# Patient Record
Sex: Male | Born: 1960 | Race: White | Hispanic: No | Marital: Single | Smoking: Former smoker
Health system: Southern US, Community
[De-identification: ages and names within clinical notes are randomized; demographics above are authoritative.]

## PROBLEM LIST (undated history)

## (undated) DIAGNOSIS — F4310 Post-traumatic stress disorder, unspecified: Secondary | ICD-10-CM

## (undated) DIAGNOSIS — I10 Essential (primary) hypertension: Secondary | ICD-10-CM

## (undated) DIAGNOSIS — E785 Hyperlipidemia, unspecified: Secondary | ICD-10-CM

## (undated) HISTORY — PX: WISDOM TOOTH EXTRACTION: SHX21

## (undated) HISTORY — PX: TONSILLECTOMY: SUR1361

## (undated) HISTORY — PX: BACK SURGERY: SHX140

---

## 1998-11-15 ENCOUNTER — Encounter: Admission: RE | Admit: 1998-11-15 | Discharge: 1999-02-13 | Payer: Self-pay | Admitting: Anesthesiology

## 1999-02-01 ENCOUNTER — Encounter: Admission: RE | Admit: 1999-02-01 | Discharge: 1999-02-16 | Payer: Self-pay | Admitting: Neurological Surgery

## 1999-02-09 ENCOUNTER — Encounter: Payer: Self-pay | Admitting: Neurological Surgery

## 1999-02-09 ENCOUNTER — Encounter: Admission: RE | Admit: 1999-02-09 | Discharge: 1999-02-09 | Payer: Self-pay | Admitting: Neurological Surgery

## 1999-04-29 ENCOUNTER — Encounter: Payer: Self-pay | Admitting: Neurological Surgery

## 1999-05-03 ENCOUNTER — Inpatient Hospital Stay (HOSPITAL_COMMUNITY): Admission: RE | Admit: 1999-05-03 | Discharge: 1999-05-06 | Payer: Self-pay | Admitting: Neurological Surgery

## 1999-05-03 ENCOUNTER — Encounter: Payer: Self-pay | Admitting: Neurological Surgery

## 1999-05-24 ENCOUNTER — Encounter: Payer: Self-pay | Admitting: Neurological Surgery

## 1999-05-24 ENCOUNTER — Encounter: Admission: RE | Admit: 1999-05-24 | Discharge: 1999-05-24 | Payer: Self-pay | Admitting: Neurological Surgery

## 1999-07-04 ENCOUNTER — Encounter: Admission: RE | Admit: 1999-07-04 | Discharge: 1999-07-04 | Payer: Self-pay | Admitting: Neurological Surgery

## 1999-07-04 ENCOUNTER — Encounter: Payer: Self-pay | Admitting: Neurological Surgery

## 1999-08-26 ENCOUNTER — Encounter: Payer: Self-pay | Admitting: Neurological Surgery

## 1999-08-26 ENCOUNTER — Encounter: Admission: RE | Admit: 1999-08-26 | Discharge: 1999-08-26 | Payer: Self-pay | Admitting: Neurological Surgery

## 1999-10-25 ENCOUNTER — Encounter: Admission: RE | Admit: 1999-10-25 | Discharge: 1999-12-27 | Payer: Self-pay | Admitting: Neurological Surgery

## 2000-04-14 ENCOUNTER — Encounter: Payer: Self-pay | Admitting: Neurological Surgery

## 2000-04-14 ENCOUNTER — Ambulatory Visit (HOSPITAL_COMMUNITY): Admission: RE | Admit: 2000-04-14 | Discharge: 2000-04-14 | Payer: Self-pay | Admitting: Neurological Surgery

## 2000-07-23 ENCOUNTER — Encounter: Admission: RE | Admit: 2000-07-23 | Discharge: 2000-07-31 | Payer: Self-pay | Admitting: Anesthesiology

## 2009-01-24 ENCOUNTER — Emergency Department (HOSPITAL_COMMUNITY): Admission: EM | Admit: 2009-01-24 | Discharge: 2009-01-24 | Payer: Self-pay | Admitting: Emergency Medicine

## 2010-08-05 NOTE — Op Note (Signed)
[facility]  Patient:    Brandon Thornton, Brandon Thornton                   MRN: [id number] Proc. Date: 05/03/99 Adm. Date:  [id number] Attending:  Jonne Ply                           Operative Report  PREOPERATIVE DIAGNOSES: 1. Spondylolisthesis lumbar vertebrae-5/sacral vertebrae-1. 2. Herniated nucleus pulposus lumbar vertebrae-4/5 and lumbar vertebrae-5/sacral    vertebrae-1. 3. Lumbar radiculopathy.  POSTOPERATIVE DIAGNOSES: 1. Spondylolisthesis lumbar vertebrae-5/sacral vertebrae-1. 2. Herniated nucleus pulposus lumbar vertebrae-4/5 and lumbar vertebrae-5/sacral    vertebrae-1. 3. Lumbar radiculopathy.  OPERATIONS: 1. Lumbar Gill procedure L5. 2. Discectomy L4/5 and L5-S1. 3. Posterior interbody arthrodesis with Ray cage technique L4/5 and L5-S1. 4. Fixation from L4 to sacrum with pedicular segmental fixation and posterior iliac    crest bone graft from right, posterior-superior iliac crest through separate  fascial incisions.  SURGEON:  Stefani Dama, M.D.  ASSISTANT:  Hewitt Shorts, M.D.  ANESTHESIA:  General endotracheal anesthesia.  INDICATION:  The patient is a 50 year old individual who has had significant back and bilateral lower extremity pain.  He was found to have herniated nucleus pulposus at L4/5 and at L5-S1 and spondylolisthesis at L5-S1.  He failed extensive effort at conservative management and is taken to the operating room.  DESCRIPTION OF PROCEDURE:  The patient was brought to the operating room supine on a stretcher.  After smooth induction of general endotracheal anesthesia, he was  turned prone.  The back was shaved, prepped with DuraPrep, and draped in a sterile fashion.  A midline incision was created and carried down to the lumbodorsal fascia. Subcutaneous tissue was then dissected off to the right side and the posterior-superior iliac crest on the right side was identified.  The  ilial gluteal fascia was opened and the outer table of the ilium was decorticated.  The Taylor retractor was placed in the wound and the cortical cancellous strips of bone were harvested from the outer table of the ilium.  The underlying cancellous bone was then harvested separately.  When adequate sample of bone was obtained, the ileal gluteal fascia was closed with #1 Vicryl in interrupted fashion.  Attention was then turned to the midline where a subperiosteal dissection of the laminar arches were carried out at L4 and L5.  L5 was noted to be loose.  The Gill procedure was performed here removing the entire arch of the lamina in one piece. The common dural tube and takeoff of the L5 nerve roots were then dissected free and the L5 nerve roots were noted to be in deep clot in the stenotic area at L5-S1 with the lateral protrusion of disc at the L5-S1 space closing foraminal stenosis at that level.  With this area being decompressed, the discectomy was performed at L5-S1 removing a large portion of significantly degenerated disc from the region of the foramen itself.  This was greater on the right than on the left.  Once this was performed, decompression of the nerve roots were performed along ith it.  L4/5 was then decompressed removing a large central disc herniation in the  subligamentous space and evacuating the disc space totally using a combination f rongeurs, curets, osteotomes, and periosteal elevators.  With the disc spaces being evacuated, the interspaces were instrumented with 12 mm tang instrument and then 12 by 21 mm cages were placed  at L5-S1 and countersunk  appropriately.  At L4/5, 12 by 21 mm cages were also placed after instrumenting the disc spaces  appropriately.  Pedicle entry sites were then chosen at L4, L5, and S1.  These ere sounded out and then tapped with a 5.25 tap and 5.75 by 40 screws were placed in all the holes except for the right  sacral hole which took a 45 mm screw. Localization of the screws were then obtained radiographically after connecting the screw tops with the curved rod measuring 60 mm in length.  Good positioning of the hardware was noted.  Lateral gutters were then packed with cortical cancellous bone that was harvested previously.  Cages were packed with  bone and plastic endcaps were applied.  The area was copiously irrigated with antibiotic irrigating solution and then some pieces of subcutaneous fat and fascia which were harvested at the beginning of the case from the subcutaneous region ere placed over the common dural tube and the takeoff of the L4 and the L5 nerve roots respectively.  The wound was then closed with #1 Vicryl in the lumbodorsal fascia and 2-0 Vicryl in the subcutaneous and subcuticular tissue, 3-0 Vicryl subcuticularly for final skin closure.  The patient tolerated the procedure well.  Blood loss was estimated at 1200 cc nd three units of CellSaver totally 750 cc was returned to the patient. DD:  05/03/99 TD:  05/03/99 Job: [id number]

## 2016-01-05 ENCOUNTER — Emergency Department (HOSPITAL_COMMUNITY)
Admission: EM | Admit: 2016-01-05 | Discharge: 2016-01-05 | Disposition: A | Discharge status: Home / Self Care | Payer: Medicare HMO | Attending: Emergency Medicine | Admitting: Emergency Medicine

## 2016-01-05 ENCOUNTER — Emergency Department (HOSPITAL_COMMUNITY): Payer: Medicare HMO

## 2016-01-05 ENCOUNTER — Encounter (HOSPITAL_COMMUNITY): Payer: Self-pay

## 2016-01-05 DIAGNOSIS — B86 Scabies: Secondary | ICD-10-CM | POA: Diagnosis not present

## 2016-01-05 DIAGNOSIS — Z87891 Personal history of nicotine dependence: Secondary | ICD-10-CM | POA: Insufficient documentation

## 2016-01-05 DIAGNOSIS — R079 Chest pain, unspecified: Secondary | ICD-10-CM | POA: Diagnosis present

## 2016-01-05 DIAGNOSIS — Z7982 Long term (current) use of aspirin: Secondary | ICD-10-CM | POA: Insufficient documentation

## 2016-01-05 DIAGNOSIS — R0789 Other chest pain: Secondary | ICD-10-CM | POA: Insufficient documentation

## 2016-01-05 DIAGNOSIS — K219 Gastro-esophageal reflux disease without esophagitis: Secondary | ICD-10-CM | POA: Insufficient documentation

## 2016-01-05 LAB — CBC
HCT: 45.3 % (ref 39.0–52.0)
Hemoglobin: 15.4 g/dL (ref 13.0–17.0)
MCH: 31 pg (ref 26.0–34.0)
MCHC: 34 g/dL (ref 30.0–36.0)
MCV: 91.3 fL (ref 78.0–100.0)
PLATELETS: 251 10*3/uL (ref 150–400)
RBC: 4.96 MIL/uL (ref 4.22–5.81)
RDW: 12.7 % (ref 11.5–15.5)
WBC: 11 10*3/uL — AB (ref 4.0–10.5)

## 2016-01-05 LAB — BASIC METABOLIC PANEL
Anion gap: 12 (ref 5–15)
BUN: 9 mg/dL (ref 6–20)
CHLORIDE: 100 mmol/L — AB (ref 101–111)
CO2: 26 mmol/L (ref 22–32)
CREATININE: 1 mg/dL (ref 0.61–1.24)
Calcium: 9.9 mg/dL (ref 8.9–10.3)
Glucose, Bld: 118 mg/dL — ABNORMAL HIGH (ref 65–99)
Potassium: 3.7 mmol/L (ref 3.5–5.1)
SODIUM: 138 mmol/L (ref 135–145)

## 2016-01-05 LAB — I-STAT TROPONIN, ED
TROPONIN I, POC: 0 ng/mL (ref 0.00–0.08)
TROPONIN I, POC: 0.01 ng/mL (ref 0.00–0.08)

## 2016-01-05 LAB — HEPATIC FUNCTION PANEL
ALT: 37 U/L (ref 17–63)
AST: 36 U/L (ref 15–41)
Albumin: 4.6 g/dL (ref 3.5–5.0)
Alkaline Phosphatase: 74 U/L (ref 38–126)
BILIRUBIN DIRECT: 0.2 mg/dL (ref 0.1–0.5)
BILIRUBIN INDIRECT: 1.2 mg/dL — AB (ref 0.3–0.9)
BILIRUBIN TOTAL: 1.4 mg/dL — AB (ref 0.3–1.2)
Total Protein: 8 g/dL (ref 6.5–8.1)

## 2016-01-05 LAB — LIPASE, BLOOD: LIPASE: 27 U/L (ref 11–51)

## 2016-01-05 MED ORDER — GI COCKTAIL ~~LOC~~
30.0000 mL | Freq: Once | ORAL | Status: AC
  Administered 2016-01-05: 30 mL via ORAL
  Filled 2016-01-05: qty 30

## 2016-01-05 MED ORDER — PERMETHRIN 5 % EX CREA: TOPICAL_CREAM | CUTANEOUS | 0 refills | Status: DC

## 2016-01-05 MED ORDER — RANITIDINE HCL 150 MG PO TABS: 150.0000 mg | ORAL_TABLET | Freq: Two times a day (BID) | ORAL | 0 refills | Status: DC

## 2016-01-05 MED ORDER — HYDROXYZINE HCL 25 MG PO TABS: 25.0000 mg | ORAL_TABLET | Freq: Four times a day (QID) | ORAL | 0 refills | Status: DC | PRN

## 2016-01-05 MED ORDER — HYDROXYZINE HCL 25 MG PO TABS
50.0000 mg | ORAL_TABLET | Freq: Once | ORAL | Status: AC
  Administered 2016-01-05: 50 mg via ORAL
  Filled 2016-01-05: qty 2

## 2016-01-05 NOTE — ED Provider Notes (Signed)
[facility]-EMERGENCY DEPT Provider Note   CSN: [id number] Arrival date & time: 01/05/16  1447     History   Chief Complaint No chief complaint on file.   HPI Brandon Thornton is a 55 y.o. male.  HPI 55 year old male with no significant past medical history presents with transient chest and left arm pain. The patient states his symptoms started last night. He ate 2 corn dogs and then began to feel mild, epigastric bloating and aching pain. The pain occasionally radiated to his left shoulder and arm. He belched several times and his symptoms improved and he was able sleep throughout the night. This morning, the patient has had persistent, mild, but improving pain. He also noticed an aching sensation in his left shoulder and arm. He subsequent decided to present for evaluation. He was advised to come to the ED by his friend who is a Engineer, civil (consulting)nurse. Currently, he states his pain has completely resolved and he denies any complaints. Denies any shortness of breath, palpitations, or diaphoresis.  History reviewed. No pertinent past medical history.  There are no active problems to display for this patient.   History reviewed. No pertinent surgical history.     Home Medications    Prior to Admission medications   Medication Sig Start Date End Date Taking? Authorizing Provider  aspirin 81 MG chewable tablet Chew 81 mg by mouth daily.   Yes Historical Provider, MD  diphenhydrAMINE (BENADRYL) 25 MG tablet Take 25 mg by mouth every 6 (six) hours as needed for itching.   Yes Historical Provider, MD  ibuprofen (ADVIL,MOTRIN) 200 MG tablet Take 200 mg by mouth every 6 (six) hours as needed.   Yes Historical Provider, MD  hydrOXYzine (ATARAX/VISTARIL) 25 MG tablet Take 1-2 tablets (25-50 mg total) by mouth every 6 (six) hours as needed for itching. 01/05/16   Shaune Pollackameron Jamisen Duerson, MD  permethrin (ELIMITE) 5 % cream Apply to affected area once 01/05/16   Shaune Pollackameron Kamare Caspers, MD  ranitidine (ZANTAC) 150 MG tablet Take 1  tablet (150 mg total) by mouth 2 (two) times daily. 01/05/16 01/15/16  Shaune Pollackameron Tarra Pence, MD    Family History No family history on file.  Social History Social History  Substance Use Topics  . Smoking status: Former Games developermoker  . Smokeless tobacco: Never Used  . Alcohol use Not on file     Allergies   Review of patient's allergies indicates no known allergies.   Review of Systems Review of Systems  Constitutional: Negative for chills, fatigue and fever.  HENT: Negative for congestion and rhinorrhea.   Eyes: Negative for visual disturbance.  Respiratory: Positive for chest tightness (Transient, now resolved). Negative for cough, shortness of breath and wheezing.   Cardiovascular: Positive for chest pain (Transient, now resolved). Negative for leg swelling.  Gastrointestinal: Negative for abdominal pain, diarrhea, nausea and vomiting.  Genitourinary: Negative for dysuria and flank pain.  Musculoskeletal: Negative for neck pain and neck stiffness.  Skin: Negative for rash and wound.  Allergic/Immunologic: Negative for immunocompromised state.  Neurological: Negative for syncope, weakness and headaches.  All other systems reviewed and are negative.    Physical Exam Updated Vital Signs BP 152/86 (BP Location: Right Arm)   Pulse 79   Temp 98 F (36.7 C) (Oral)   Resp 18   SpO2 99%   Physical Exam  Constitutional: He is oriented to person, place, and time. He appears well-developed and well-nourished. No distress.  HENT:  Head: Normocephalic and atraumatic.  Eyes: Conjunctivae are normal.  Neck:  Neck supple.  Cardiovascular: Normal rate, regular rhythm and normal heart sounds.  Exam reveals no friction rub.   No murmur heard. Pulmonary/Chest: Effort normal and breath sounds normal. No respiratory distress. He has no wheezes. He has no rales.  Abdominal: He exhibits no distension.  Musculoskeletal: He exhibits no edema.  Neurological: He is alert and oriented to person,  place, and time. He exhibits normal muscle tone.  Skin: Skin is warm. Capillary refill takes less than 2 seconds.  Diffuse skin excoriations. Linear, erythematous lesions throughout the trunk and digits with interdigital involvement.  Psychiatric: He has a normal mood and affect.  Nursing note and vitals reviewed.    ED Treatments / Results  Labs (all labs ordered are listed, but only abnormal results are displayed) Labs Reviewed  BASIC METABOLIC PANEL - Abnormal; Notable for the following:       Result Value   Chloride 100 (*)    Glucose, Bld 118 (*)    All other components within normal limits  CBC - Abnormal; Notable for the following:    WBC 11.0 (*)    All other components within normal limits  HEPATIC FUNCTION PANEL - Abnormal; Notable for the following:    Total Bilirubin 1.4 (*)    Indirect Bilirubin 1.2 (*)    All other components within normal limits  LIPASE, BLOOD  I-STAT TROPOININ, ED  I-STAT TROPOININ, ED    EKG  EKG Interpretation  Date/Time:  Wednesday January 05 2016 14:58:14 EDT Ventricular Rate:  82 PR Interval:  146 QRS Duration: 82 QT Interval:  364 QTC Calculation: 425 R Axis:   12 Text Interpretation:  Normal sinus rhythm Normal ECG No significant change since last tracing Confirmed by Rayshell Goecke MD, Sheria Lang [phone]) on 01/06/2016 2:18:43 AM       Radiology Dg Chest 2 View  Result Date: 01/05/2016 CLINICAL DATA:  Left arm pain. EXAM: CHEST  2 VIEW COMPARISON:  None. FINDINGS: The heart size and mediastinal contours are within normal limits. Both lungs are clear. No pneumothorax or pleural effusion is noted. Old left clavicular fracture is noted. IMPRESSION: No active cardiopulmonary disease. Electronically Signed   By: Lupita Raider, M.D.   On: 01/05/2016 15:32    Procedures Procedures (including critical care time)  Medications Ordered in ED Medications  gi cocktail (Maalox,Lidocaine,Donnatal) (30 mLs Oral Given 01/05/16 1956)  hydrOXYzine  (ATARAX/VISTARIL) tablet 50 mg (50 mg Oral Given 01/05/16 2219)     Initial Impression / Assessment and Plan / ED Course  I have reviewed the triage vital signs and the nursing notes.  Pertinent labs & imaging results that were available during my care of the patient were reviewed by me and considered in my medical decision making (see chart for details).  Clinical Course   56 yo M with no significant PMHx who p/w atypical chest pain that resolves with belching, began after eating. Also incidentally noted pruritic rash. Regarding his CP - suspect GERD/gastritis given relaiton to eating, resolution with belching, worsening with lying flat, and correlation with alcohol use. LFTs, Bili normal and lipase normal - doubt chole, pancreatitis. DDx includes mSK chest wall pain, less likely ACS. HEART score <3 and dleta trop negative, EKG non-ischemic - will need outpt follow-up but low suspicion for ACS at this time. CXR is normal. Otherwise, regarding his rash - exam is c/w scabies. No signs of superimposed cellulitis. Will treat with trial of PPIs for atypical CP/GERD, as well as permethrin/atarax for scabies. outpt f/u  encouraged for further work-up.  Final Clinical Impressions(s) / ED Diagnoses   Final diagnoses:  Scabies  Atypical chest pain  Gastroesophageal reflux disease without esophagitis    New Prescriptions Discharge Medication List as of 01/05/2016 10:22 PM    START taking these medications   Details  hydrOXYzine (ATARAX/VISTARIL) 25 MG tablet Take 1-2 tablets (25-50 mg total) by mouth every 6 (six) hours as needed for itching., Starting Wed 01/05/2016, Print    permethrin (ELIMITE) 5 % cream Apply to affected area once, Print    ranitidine (ZANTAC) 150 MG tablet Take 1 tablet (150 mg total) by mouth 2 (two) times daily., Starting Wed 01/05/2016, Until Sat 01/15/2016, Print         Shaune Pollack, MD 01/06/16 [phone]

## 2016-01-05 NOTE — ED Notes (Signed)
Patient verbalized understanding of discharge instructions and denies any further needs or questions at this time. VS stable. Patient ambulatory with steady gait.  

## 2016-01-05 NOTE — ED Triage Notes (Signed)
Patient complains of left arm pain and feeling on indigestion since 0900. denies cp. States that the pain has been constant. NAD

## 2018-07-25 ENCOUNTER — Other Ambulatory Visit: Payer: Self-pay

## 2018-07-25 ENCOUNTER — Ambulatory Visit
Admission: RE | Admit: 2018-07-25 | Discharge: 2018-07-25 | Disposition: A | Discharge status: Home / Self Care | Payer: Medicare HMO | Source: Ambulatory Visit | Attending: Family Medicine | Admitting: Family Medicine

## 2018-07-25 ENCOUNTER — Other Ambulatory Visit: Payer: Self-pay | Admitting: Family Medicine

## 2018-07-25 DIAGNOSIS — M25512 Pain in left shoulder: Secondary | ICD-10-CM

## 2018-07-25 DIAGNOSIS — M542 Cervicalgia: Secondary | ICD-10-CM

## 2018-07-25 DIAGNOSIS — M25511 Pain in right shoulder: Secondary | ICD-10-CM

## 2018-07-25 DIAGNOSIS — M25559 Pain in unspecified hip: Secondary | ICD-10-CM

## 2020-02-20 IMAGING — CR CERVICAL SPINE - 2-3 VIEW
2 series · 2 of 2 positions shown · non-contrast
Comparison: 09/13/2006

CLINICAL DATA: Neck pain

EXAM:
CERVICAL SPINE - 2-3 VIEW

[w cervical spine ap]
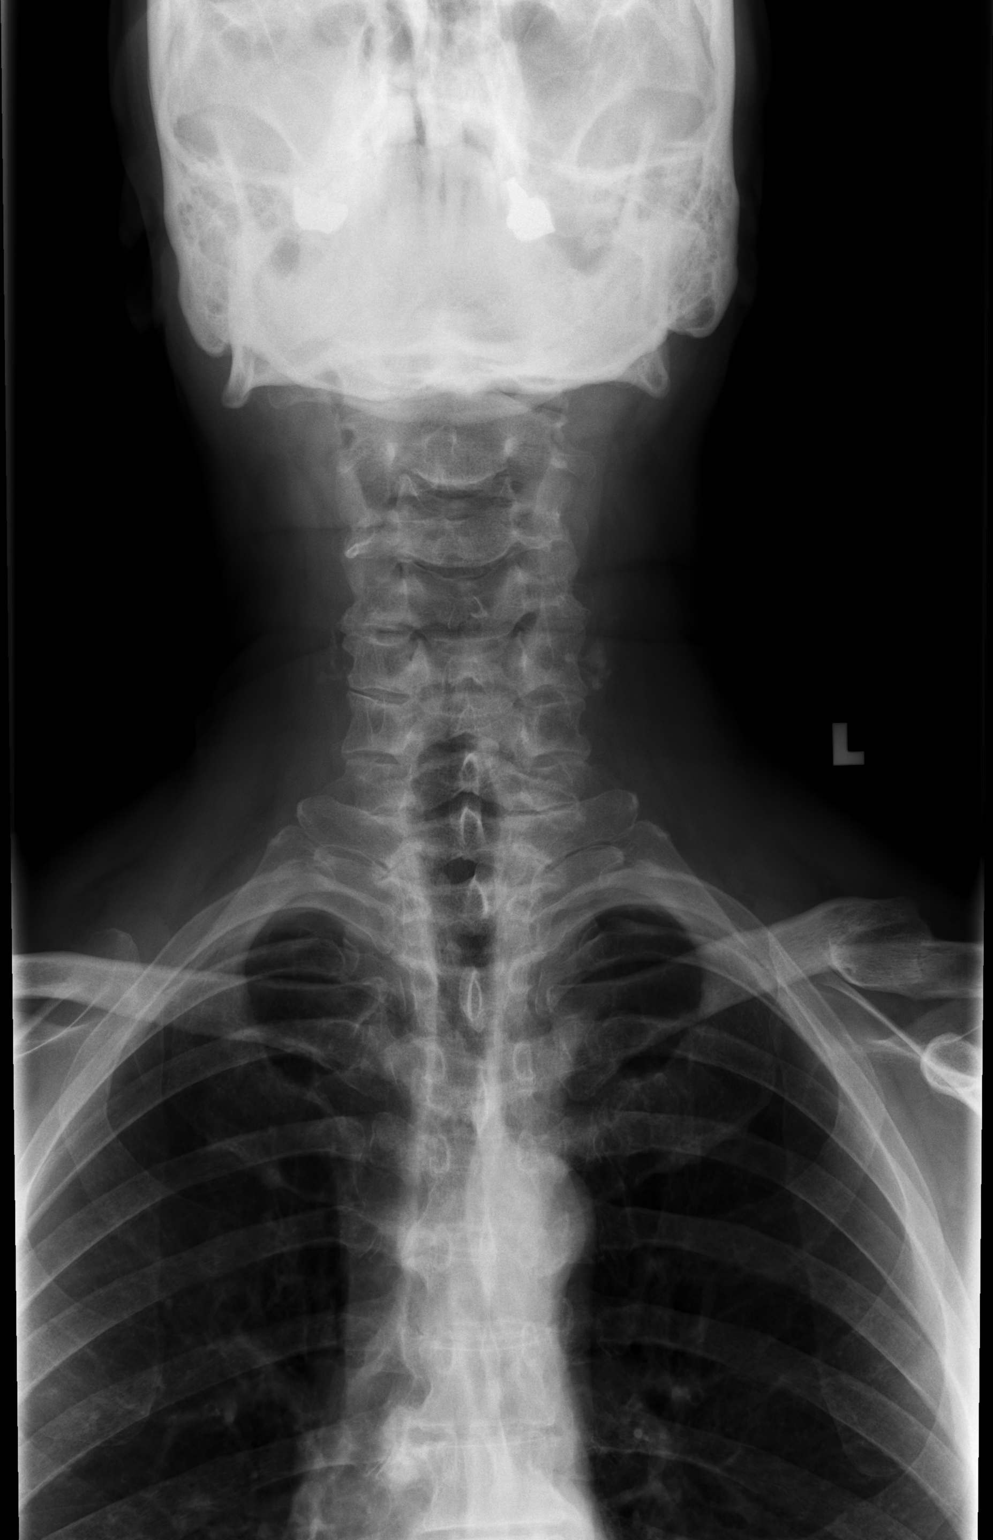

[w cervical spine lat]
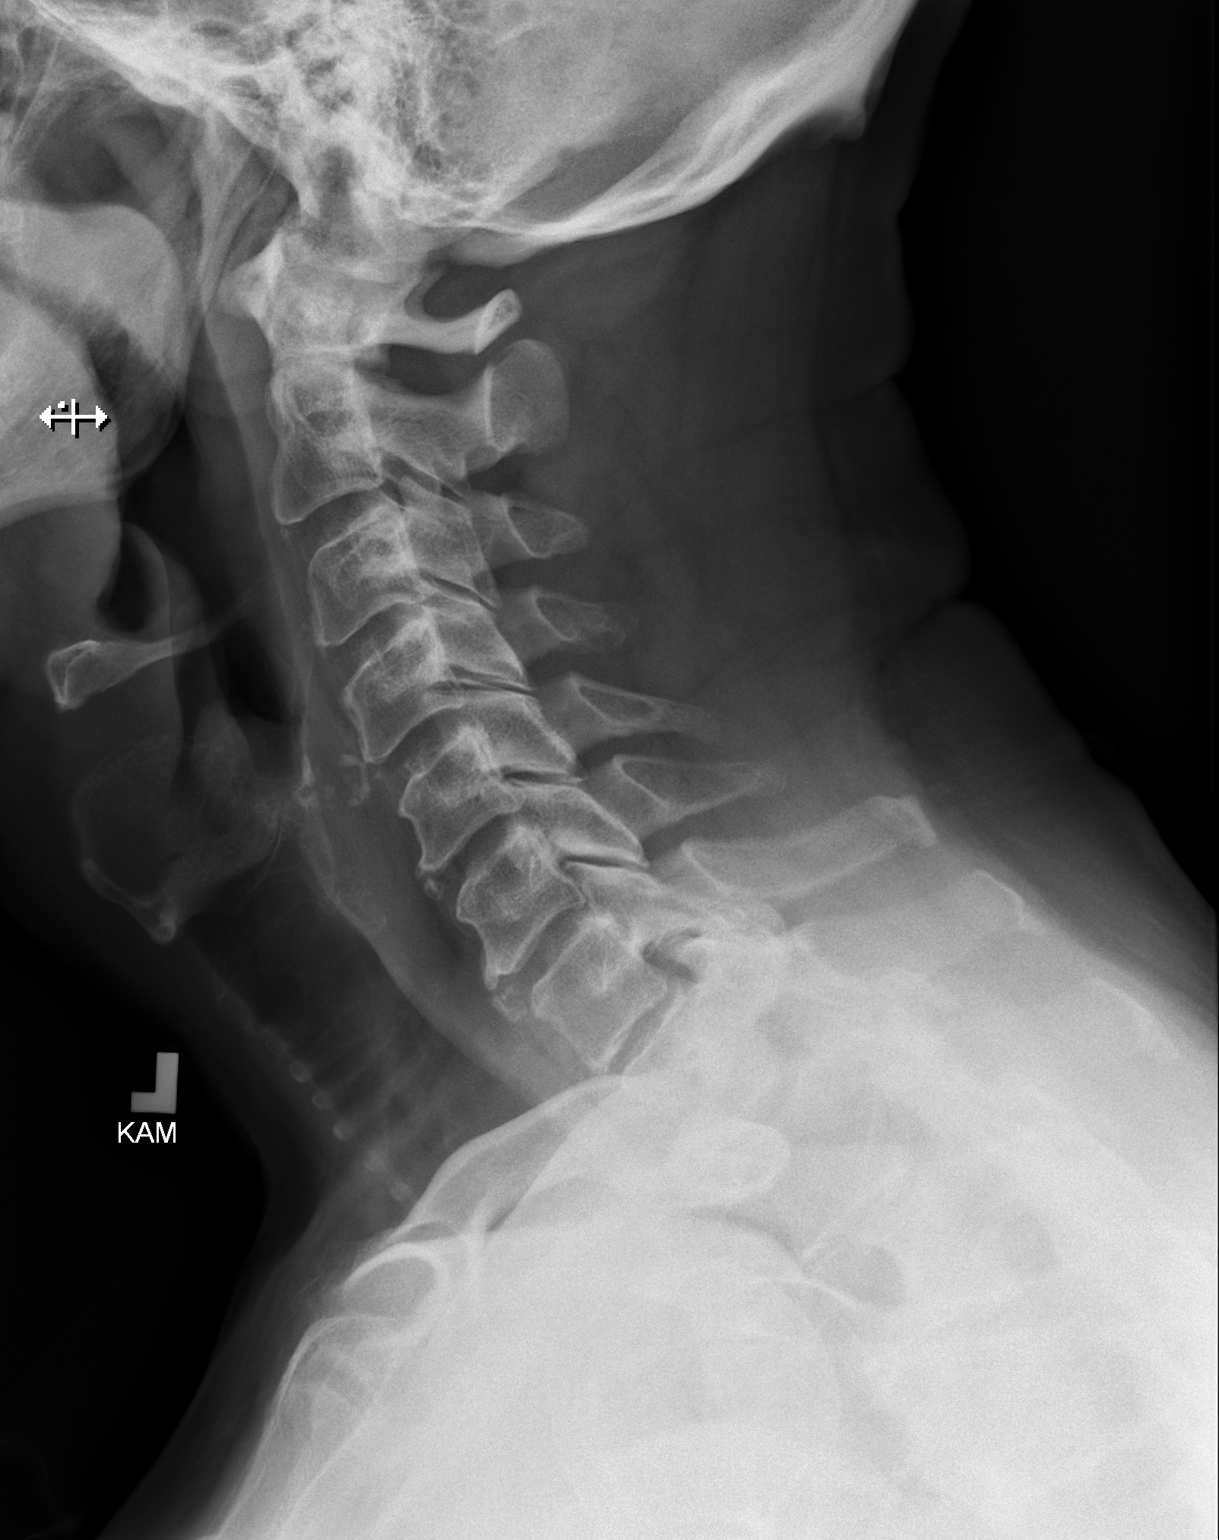

[2 of 2 positions shown; findings below may reference images not displayed]

FINDINGS: There is no evidence of cervical spine fracture or prevertebral soft
tissue swelling. Alignment is normal. No other significant bone
abnormalities are identified. Mild degenerative changes are noted,
greatest at the C7-T1 level. Bilateral carotid artery calcifications
are noted. There is an old healed left-sided clavicle fracture.
IMPRESSION: 1. No acute osseous abnormality detected.
2. Mild degenerative changes.
3. Carotid artery calcifications noted.
4. Old healed left-sided clavicle fracture.

## 2020-02-20 IMAGING — CR RIGHT SHOULDER - 1 VIEW
1 series · 1 of 1 positions shown · non-contrast
Comparison: None.

CLINICAL DATA: Pain

EXAM:
RIGHT SHOULDER - 1 VIEW

[w shoulder grashey right]
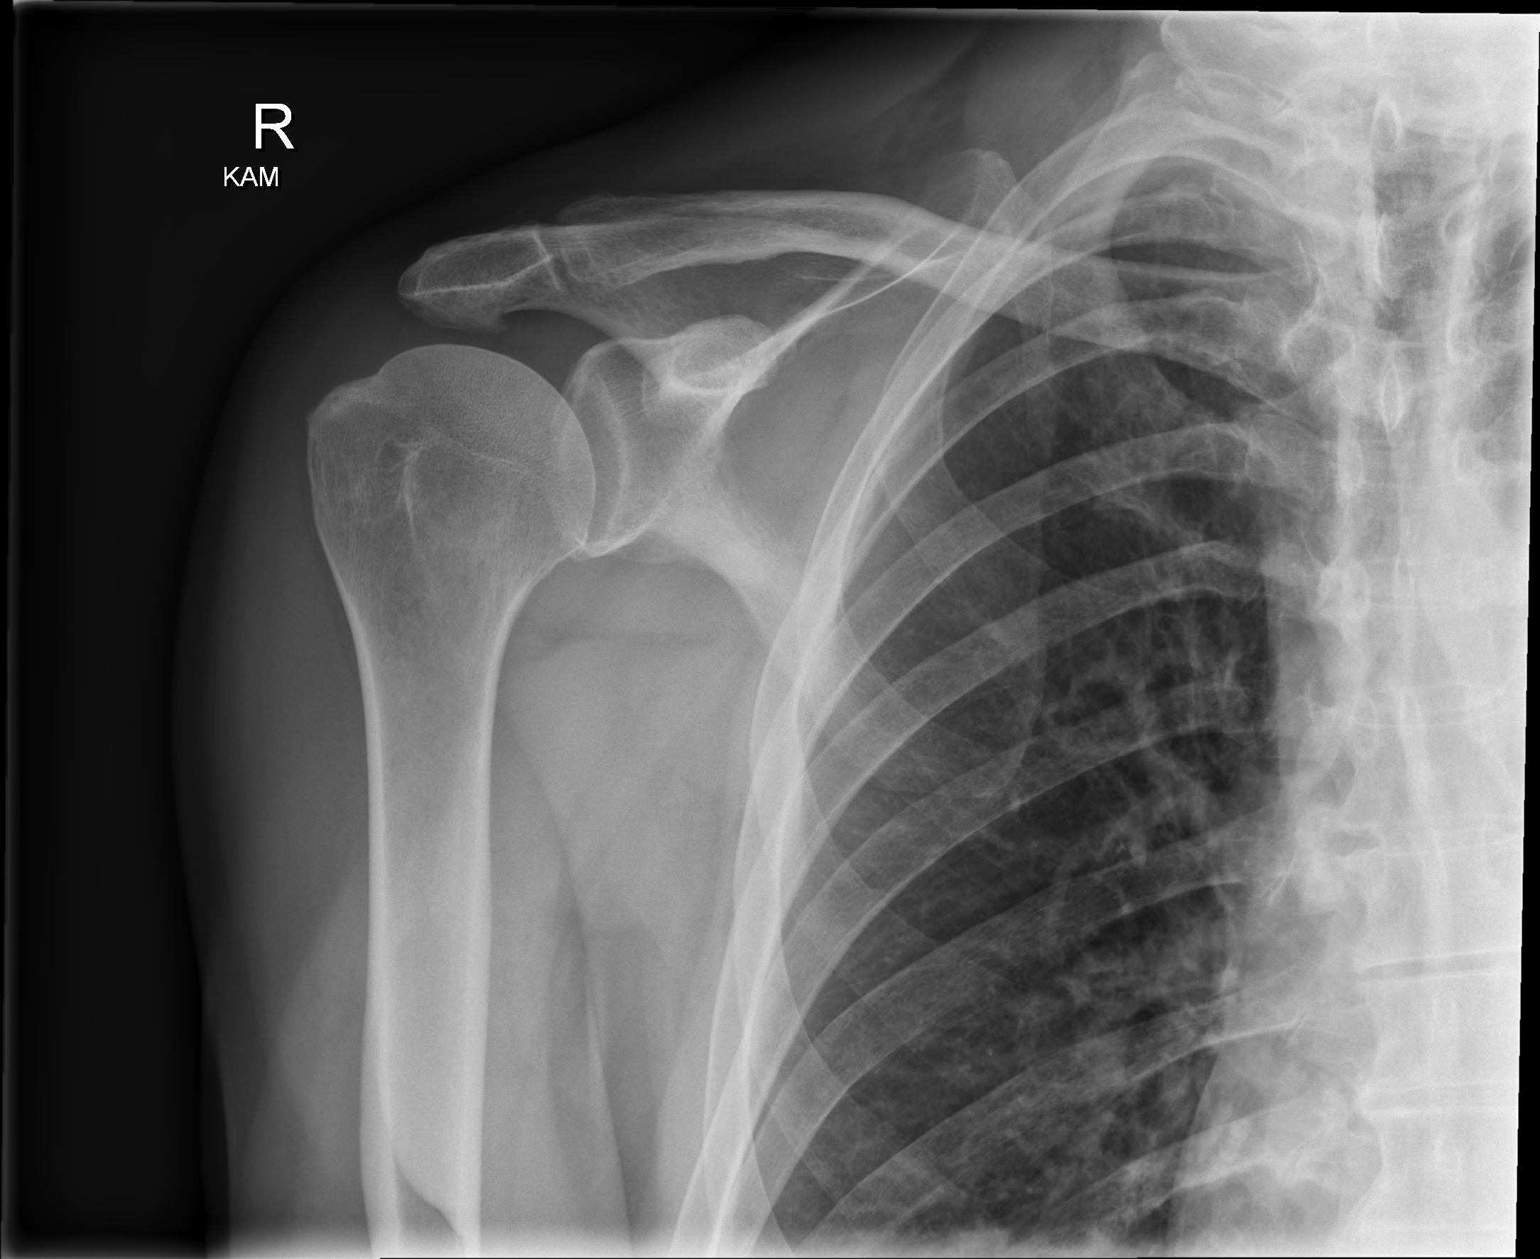

[1 of 1 positions shown; findings below may reference images not displayed]

FINDINGS: Examination is limited by single view technique. There is no
dislocation. No acute displaced fracture. Degenerative changes are
noted of the right acromioclavicular joint.
IMPRESSION: 1. Examination limited by single view technique.
2. No acute osseous abnormality detected.

## 2022-06-19 DEATH — deceased

## 2023-03-22 ENCOUNTER — Other Ambulatory Visit: Payer: Self-pay

## 2023-03-22 ENCOUNTER — Encounter (HOSPITAL_BASED_OUTPATIENT_CLINIC_OR_DEPARTMENT_OTHER): Payer: Self-pay | Admitting: Orthopedic Surgery

## 2023-03-23 ENCOUNTER — Encounter (HOSPITAL_BASED_OUTPATIENT_CLINIC_OR_DEPARTMENT_OTHER)
Admission: RE | Admit: 2023-03-23 | Discharge: 2023-03-23 | Disposition: A | Discharge status: Home / Self Care | Payer: Self-pay | Source: Ambulatory Visit | Attending: Orthopedic Surgery | Admitting: Orthopedic Surgery

## 2023-03-23 DIAGNOSIS — Z01810 Encounter for preprocedural cardiovascular examination: Secondary | ICD-10-CM | POA: Diagnosis present

## 2023-03-23 DIAGNOSIS — I10 Essential (primary) hypertension: Secondary | ICD-10-CM | POA: Diagnosis not present

## 2023-03-29 ENCOUNTER — Ambulatory Visit (HOSPITAL_BASED_OUTPATIENT_CLINIC_OR_DEPARTMENT_OTHER): Payer: Medicare HMO | Admitting: Anesthesiology

## 2023-03-29 ENCOUNTER — Encounter (HOSPITAL_BASED_OUTPATIENT_CLINIC_OR_DEPARTMENT_OTHER)
Admission: RE | Disposition: A | Discharge status: Home / Self Care | Payer: Self-pay | Source: Home / Self Care | Attending: Orthopedic Surgery

## 2023-03-29 ENCOUNTER — Encounter (HOSPITAL_BASED_OUTPATIENT_CLINIC_OR_DEPARTMENT_OTHER): Payer: Self-pay | Admitting: Orthopedic Surgery

## 2023-03-29 ENCOUNTER — Other Ambulatory Visit: Payer: Self-pay

## 2023-03-29 ENCOUNTER — Ambulatory Visit (HOSPITAL_BASED_OUTPATIENT_CLINIC_OR_DEPARTMENT_OTHER)
Admission: RE | Admit: 2023-03-29 | Discharge: 2023-03-29 | Disposition: A | Discharge status: Home / Self Care | Payer: Medicare HMO | Attending: Orthopedic Surgery | Admitting: Orthopedic Surgery

## 2023-03-29 DIAGNOSIS — G5622 Lesion of ulnar nerve, left upper limb: Secondary | ICD-10-CM | POA: Insufficient documentation

## 2023-03-29 DIAGNOSIS — Z87891 Personal history of nicotine dependence: Secondary | ICD-10-CM | POA: Diagnosis not present

## 2023-03-29 DIAGNOSIS — I10 Essential (primary) hypertension: Secondary | ICD-10-CM | POA: Diagnosis not present

## 2023-03-29 DIAGNOSIS — G5602 Carpal tunnel syndrome, left upper limb: Secondary | ICD-10-CM | POA: Diagnosis present

## 2023-03-29 DIAGNOSIS — Z79899 Other long term (current) drug therapy: Secondary | ICD-10-CM | POA: Insufficient documentation

## 2023-03-29 HISTORY — DX: Hyperlipidemia, unspecified: E78.5

## 2023-03-29 HISTORY — PX: ANTERIOR INTEROSSEOUS NERVE DECOMPRESSION: SHX5735

## 2023-03-29 HISTORY — DX: Essential (primary) hypertension: I10

## 2023-03-29 HISTORY — DX: Post-traumatic stress disorder, unspecified: F43.10

## 2023-03-29 SURGERY — ANTERIOR INTEROSSEOUS NERVE DECOMPRESSION
Anesthesia: General | Site: Wrist | Laterality: Left

## 2023-03-29 MED ORDER — KETOROLAC TROMETHAMINE 30 MG/ML IJ SOLN: 30.0000 mg | Freq: Once | INTRAMUSCULAR | Status: DC | PRN

## 2023-03-29 MED ORDER — PROPOFOL 10 MG/ML IV BOLUS
INTRAVENOUS | Status: DC | PRN
  Administered 2023-03-29: 100 mg via INTRAVENOUS
  Administered 2023-03-29: 200 mg via INTRAVENOUS

## 2023-03-29 MED ORDER — ACETAMINOPHEN 500 MG PO TABS
ORAL_TABLET | ORAL | Status: AC
  Filled 2023-03-29: qty 2

## 2023-03-29 MED ORDER — ACETAMINOPHEN 500 MG PO TABS
1000.0000 mg | ORAL_TABLET | Freq: Once | ORAL | Status: AC
Start: 2023-03-29 — End: 2023-03-29
  Administered 2023-03-29: 1000 mg via ORAL

## 2023-03-29 MED ORDER — MIDAZOLAM HCL 5 MG/5ML IJ SOLN
INTRAMUSCULAR | Status: DC | PRN
  Administered 2023-03-29 (×2): 1 mg via INTRAVENOUS

## 2023-03-29 MED ORDER — ONDANSETRON HCL 4 MG/2ML IJ SOLN
INTRAMUSCULAR | Status: AC
Start: 2023-03-29 — End: ?
  Filled 2023-03-29: qty 2

## 2023-03-29 MED ORDER — CEFAZOLIN SODIUM-DEXTROSE 2-4 GM/100ML-% IV SOLN
INTRAVENOUS | Status: AC
  Filled 2023-03-29: qty 100

## 2023-03-29 MED ORDER — FENTANYL CITRATE (PF) 100 MCG/2ML IJ SOLN
INTRAMUSCULAR | Status: AC
  Filled 2023-03-29: qty 2

## 2023-03-29 MED ORDER — SUCCINYLCHOLINE CHLORIDE 200 MG/10ML IV SOSY
PREFILLED_SYRINGE | INTRAVENOUS | Status: AC
Start: 2023-03-29 — End: ?
  Filled 2023-03-29: qty 10

## 2023-03-29 MED ORDER — AMISULPRIDE (ANTIEMETIC) 5 MG/2ML IV SOLN: 10.0000 mg | Freq: Once | INTRAVENOUS | Status: DC | PRN

## 2023-03-29 MED ORDER — EPHEDRINE 5 MG/ML INJ
INTRAVENOUS | Status: AC
  Filled 2023-03-29: qty 5

## 2023-03-29 MED ORDER — BUPIVACAINE HCL (PF) 0.25 % IJ SOLN
INTRAMUSCULAR | Status: DC | PRN
  Administered 2023-03-29: 9 mL

## 2023-03-29 MED ORDER — PROPOFOL 500 MG/50ML IV EMUL
INTRAVENOUS | Status: DC | PRN
  Administered 2023-03-29: 250 ug/kg/min via INTRAVENOUS

## 2023-03-29 MED ORDER — TRAMADOL HCL 50 MG PO TABS: ORAL_TABLET | ORAL | 0 refills | Status: AC

## 2023-03-29 MED ORDER — 0.9 % SODIUM CHLORIDE (POUR BTL) OPTIME
TOPICAL | Status: DC | PRN
  Administered 2023-03-29: 200 mL

## 2023-03-29 MED ORDER — SODIUM CHLORIDE 0.9 % IV SOLN: INTRAVENOUS | Status: DC | PRN

## 2023-03-29 MED ORDER — MIDAZOLAM HCL 2 MG/2ML IJ SOLN
INTRAMUSCULAR | Status: AC
  Filled 2023-03-29: qty 2

## 2023-03-29 MED ORDER — CEFAZOLIN SODIUM-DEXTROSE 2-3 GM-%(50ML) IV SOLR
INTRAVENOUS | Status: DC | PRN
  Administered 2023-03-29: 2 g via INTRAVENOUS

## 2023-03-29 MED ORDER — FENTANYL CITRATE (PF) 100 MCG/2ML IJ SOLN
INTRAMUSCULAR | Status: DC | PRN
  Administered 2023-03-29 (×2): 50 ug via INTRAVENOUS

## 2023-03-29 MED ORDER — PHENYLEPHRINE 80 MCG/ML (10ML) SYRINGE FOR IV PUSH (FOR BLOOD PRESSURE SUPPORT)
PREFILLED_SYRINGE | INTRAVENOUS | Status: AC
  Filled 2023-03-29: qty 10

## 2023-03-29 MED ORDER — PROPOFOL 500 MG/50ML IV EMUL: INTRAVENOUS | Status: DC | PRN

## 2023-03-29 MED ORDER — ONDANSETRON HCL 4 MG PO TABS: 4.0000 mg | ORAL_TABLET | Freq: Three times a day (TID) | ORAL | 0 refills | Status: AC | PRN

## 2023-03-29 MED ORDER — LACTATED RINGERS IV SOLN: INTRAVENOUS | Status: DC

## 2023-03-29 MED ORDER — LIDOCAINE HCL (CARDIAC) PF 100 MG/5ML IV SOSY
PREFILLED_SYRINGE | INTRAVENOUS | Status: DC | PRN
  Administered 2023-03-29: 20 mg via INTRAVENOUS

## 2023-03-29 MED ORDER — DEXAMETHASONE SODIUM PHOSPHATE 10 MG/ML IJ SOLN
INTRAMUSCULAR | Status: DC | PRN
  Administered 2023-03-29: 10 mg via INTRAVENOUS

## 2023-03-29 MED ORDER — ATROPINE SULFATE 0.4 MG/ML IV SOLN
INTRAVENOUS | Status: AC
  Filled 2023-03-29: qty 1

## 2023-03-29 MED ORDER — FENTANYL CITRATE (PF) 100 MCG/2ML IJ SOLN: 25.0000 ug | INTRAMUSCULAR | Status: DC | PRN

## 2023-03-29 MED ORDER — LIDOCAINE 2% (20 MG/ML) 5 ML SYRINGE
INTRAMUSCULAR | Status: AC
  Filled 2023-03-29: qty 5

## 2023-03-29 SURGICAL SUPPLY — 49 items
BLADE MINI RND TIP GREEN BEAV (BLADE) IMPLANT
BLADE SURG 15 STRL LF DISP TIS (BLADE) ×6 IMPLANT
BNDG ELASTIC 3INX 5YD STR LF (GAUZE/BANDAGES/DRESSINGS) ×6 IMPLANT
BNDG ELASTIC 4INX 5YD STR LF (GAUZE/BANDAGES/DRESSINGS) ×3 IMPLANT
BNDG ESMARK 4X9 LF (GAUZE/BANDAGES/DRESSINGS) ×3 IMPLANT
BNDG GAUZE DERMACEA FLUFF 4 (GAUZE/BANDAGES/DRESSINGS) ×3 IMPLANT
CHLORAPREP W/TINT 26 (MISCELLANEOUS) ×3 IMPLANT
CLIP TI MEDIUM 6 (CLIP) IMPLANT
CORD BIPOLAR FORCEPS 12FT (ELECTRODE) ×3 IMPLANT
COVER BACK TABLE 60X90IN (DRAPES) ×3 IMPLANT
COVER MAYO STAND STRL (DRAPES) ×3 IMPLANT
CUFF TOURN SGL QUICK 18X3 (MISCELLANEOUS) ×3 IMPLANT
CUFF TOURN SGL QUICK 18X4 (TOURNIQUET CUFF) ×3 IMPLANT
DRAPE EXTREMITY T 121X128X90 (DISPOSABLE) ×3 IMPLANT
DRAPE SURG 17X23 STRL (DRAPES) ×3 IMPLANT
GAUZE 4X4 16PLY ~~LOC~~+RFID DBL (SPONGE) IMPLANT
GAUZE PAD ABD 8X10 STRL (GAUZE/BANDAGES/DRESSINGS) ×3 IMPLANT
GAUZE SPONGE 4X4 12PLY STRL (GAUZE/BANDAGES/DRESSINGS) ×3 IMPLANT
GAUZE XEROFORM 1X8 LF (GAUZE/BANDAGES/DRESSINGS) ×3 IMPLANT
GLOVE BIO SURGEON STRL SZ7.5 (GLOVE) ×3 IMPLANT
GLOVE BIOGEL PI IND STRL 7.0 (GLOVE) IMPLANT
GLOVE BIOGEL PI IND STRL 7.5 (GLOVE) IMPLANT
GLOVE BIOGEL PI IND STRL 8 (GLOVE) ×3 IMPLANT
GLOVE BIOGEL PI IND STRL 8.5 (GLOVE) ×3 IMPLANT
GLOVE SURG ORTHO 8.0 STRL STRW (GLOVE) ×3 IMPLANT
GLOVE SURG SYN 7.5 E (GLOVE) ×2 IMPLANT
GLOVE SURG SYN 7.5 PF PI (GLOVE) IMPLANT
GOWN STRL REUS W/ TWL LRG LVL3 (GOWN DISPOSABLE) ×3 IMPLANT
GOWN STRL REUS W/TWL XL LVL3 (GOWN DISPOSABLE) ×6 IMPLANT
NDL HYPO 25X1 1.5 SAFETY (NEEDLE) ×3 IMPLANT
NEEDLE HYPO 25X1 1.5 SAFETY (NEEDLE) ×2 IMPLANT
NS IRRIG 1000ML POUR BTL (IV SOLUTION) ×3 IMPLANT
PACK BASIN DAY SURGERY FS (CUSTOM PROCEDURE TRAY) ×3 IMPLANT
PAD CAST 3X4 CTTN HI CHSV (CAST SUPPLIES) ×3 IMPLANT
PAD CAST 4YDX4 CTTN HI CHSV (CAST SUPPLIES) ×3 IMPLANT
PADDING CAST ABS COTTON 4X4 ST (CAST SUPPLIES) ×3 IMPLANT
SLEEVE SCD COMPRESS KNEE MED (STOCKING) ×3 IMPLANT
SPIKE FLUID TRANSFER (MISCELLANEOUS) IMPLANT
SPLINT PLASTER CAST FAST 5X30 (CAST SUPPLIES) IMPLANT
SPLINT PLASTER CAST XFAST 3X15 (CAST SUPPLIES) IMPLANT
STOCKINETTE 4X48 STRL (DRAPES) ×3 IMPLANT
SUT ETHILON 4 0 PS 2 18 (SUTURE) ×3 IMPLANT
SUT VIC AB 2-0 SH 27XBRD (SUTURE) ×3 IMPLANT
SUT VIC AB 4-0 PS2 18 (SUTURE) IMPLANT
SYR 10ML LL (SYRINGE) IMPLANT
SYR BULB EAR ULCER 3OZ GRN STR (SYRINGE) ×3 IMPLANT
SYR CONTROL 10ML LL (SYRINGE) ×3 IMPLANT
TOWEL GREEN STERILE FF (TOWEL DISPOSABLE) ×6 IMPLANT
UNDERPAD 30X36 HEAVY ABSORB (UNDERPADS AND DIAPERS) ×3 IMPLANT

## 2023-03-29 NOTE — Transfer of Care (Signed)
 Immediate Anesthesia Transfer of Care Note  Patient: Brandon Thornton  Procedure(s) Performed: left carpal tunnel release (Left: Wrist) left ulnar nerve decompression at elbow (Left: Elbow)  Patient Location: PACU  Anesthesia Type:General  Level of Consciousness: sedated  Airway & Oxygen Therapy: Patient Spontanous Breathing and Patient connected to face mask oxygen  Post-op Assessment: Report given to RN and Post -op Vital signs reviewed and stable  Post vital signs: Reviewed and stable  Last Vitals:  Vitals Value Taken Time  BP    Temp    Pulse 67 03/29/23 1201  Resp    SpO2 96 % 03/29/23 1201  Vitals shown include unfiled device data.  Last Pain:  Vitals:   03/29/23 0847  TempSrc: Temporal  PainSc: 0-No pain         Complications: No notable events documented.

## 2023-03-29 NOTE — H&P (Signed)
 Brandon Thornton is an 63 y.o. male.   Chief Complaint: hand numbness HPI: 63 yo male with left carpal tunnel syndrome and ulnar nerve compression at elbow.  Positive nerve conduction studies for carpal tunnel syndrome.  He has symptoms in ring and small fingers and physical exam findings at ulnar nerve at elbow.  He wishes to proceed with left carpal tunnel release and ulnar nerve decompression at elbow with possible transposition.    Allergies:  Allergies  Allergen Reactions   Codeine Nausea Only   Fentanyl  Other (See Comments)    Chest pain, unable to function   Hydrocodone Anxiety   Oxycodone Anxiety    Past Medical History:  Diagnosis Date   HLD (hyperlipidemia)    Hypertension    PTSD (post-traumatic stress disorder)    Veteran- has claustrophobia    Past Surgical History:  Procedure Laterality Date   BACK SURGERY     TONSILLECTOMY     WISDOM TOOTH EXTRACTION      Family History: History reviewed. No pertinent family history.  Social History:   reports that he has quit smoking. He has never used smokeless tobacco. He reports current alcohol use. He reports current drug use. Drug: Marijuana.  Medications: Medications Prior to Admission  Medication Sig Dispense Refill   amLODipine (NORVASC) 5 MG tablet Take 5 mg by mouth daily.     cyanocobalamin (VITAMIN B12) 500 MCG tablet Take 500 mcg by mouth daily.     diphenhydrAMINE (BENADRYL) 25 MG tablet Take 25 mg by mouth every 6 (six) hours as needed for itching.     gabapentin (NEURONTIN) 300 MG capsule Take 300 mg by mouth at bedtime.     ibuprofen (ADVIL,MOTRIN) 200 MG tablet Take 800 mg by mouth every 8 (eight) hours as needed for mild pain (pain score 1-3).     rosuvastatin (CRESTOR) 40 MG tablet Take 40 mg by mouth daily.      No results found for this or any previous visit (from the past 48 hours).  No results found.    Height 5' 7 (1.702 m), weight 84.4 kg.  General appearance: alert, cooperative, and  appears stated age Head: Normocephalic, without obvious abnormality, atraumatic Neck: supple, symmetrical, trachea midline Extremities: Intact capillary refill all digits.  Tingling in fingertips. +epl/fpl/io.  No wounds.  Skin: Skin color, texture, turgor normal. No rashes or lesions Neurologic: Grossly normal Incision/Wound: none  Assessment/Plan Left carpal tunnel syndrome and ulnar nerve compression at elbow.  He wishes to proceed with left carpal tunnel release and ulnar nerve decompression at elbow with possible transposition.  Non operative and operative treatment options have been discussed with the patient and patient wishes to proceed with operative treatment. Risks, benefits, and alternatives of surgery have been discussed and the patient agrees with the plan of care.   Brandon Thornton 03/29/2023, 8:44 AM

## 2023-03-29 NOTE — Anesthesia Preprocedure Evaluation (Signed)
 Anesthesia Evaluation  Patient identified by MRN, date of birth, ID band Patient awake    Reviewed: Allergy & Precautions, NPO status , Patient's Chart, lab work & pertinent test results  Airway Mallampati: III  TM Distance: >3 FB Neck ROM: Full    Dental  (+) Missing, Edentulous Upper, Chipped,    Pulmonary former smoker   Pulmonary exam normal        Cardiovascular hypertension, Pt. on medications Normal cardiovascular exam     Neuro/Psych  PSYCHIATRIC DISORDERS Anxiety     PTSD (post-traumatic stress disorder)negative neurological ROS     GI/Hepatic negative GI ROS,,,(+)     substance abuse    Endo/Other  negative endocrine ROS    Renal/GU negative Renal ROS     Musculoskeletal negative musculoskeletal ROS (+)    Abdominal   Peds  Hematology negative hematology ROS (+)   Anesthesia Other Findings left carpal tunnel release left ulnar nerve decompression at elbow possible transposition  Reproductive/Obstetrics                             Anesthesia Physical Anesthesia Plan  ASA: 2  Anesthesia Plan: General   Post-op Pain Management:    Induction: Intravenous  PONV Risk Score and Plan: 2 and Ondansetron , Dexamethasone , Midazolam  and Treatment may vary due to age or medical condition  Airway Management Planned: LMA  Additional Equipment:   Intra-op Plan:   Post-operative Plan: Extubation in OR  Informed Consent: I have reviewed the patients History and Physical, chart, labs and discussed the procedure including the risks, benefits and alternatives for the proposed anesthesia with the patient or authorized representative who has indicated his/her understanding and acceptance.     Dental advisory given  Plan Discussed with: CRNA  Anesthesia Plan Comments:        Anesthesia Quick Evaluation

## 2023-03-29 NOTE — Op Note (Signed)
 03/29/2023 [name]                              OPERATIVE REPORT   PREOPERATIVE DIAGNOSIS: 1.  Left carpal tunnel syndrome 2.  Left ulnar nerve compression at elbow  POSTOPERATIVE DIAGNOSIS:  1.  Left carpal tunnel syndrome 2.  Left ulnar nerve compression at elbow  PROCEDURE: 1.  Left carpal tunnel release 2.  Left ulnar nerve decompression at elbow  SURGEON:  Franky Curia, MD  ASSISTANT: Arley Curia, MD  ANESTHESIA: General  IV FLUIDS:  Per anesthesia flow sheet.  ESTIMATED BLOOD LOSS:  Minimal.  COMPLICATIONS:  None.  SPECIMENS:  None.  TOURNIQUET TIME:    Total Tourniquet Time Documented: Upper Arm (Left) - 38 minutes Total: Upper Arm (Left) - 38 minutes   DISPOSITION:  Stable to PACU.  LOCATION: [name]  INDICATIONS:  63 y.o. yo male with numbness and tingling left hand.  Positive nerve conduction studies. He wishes to proceed with left carpal tunnel release and ulnar nerve decompression at the elbow with possible transposition.  Risks, benefits and alternatives of surgery were discussed including the risk of blood loss; infection; damage to nerves, vessels, tendons, ligaments, bone; failure of surgery; need for additional surgery; complications with wound healing; continued pain; recurrence of carpal tunnel syndrome; and damage to motor branch. He voiced understanding of these risks and elected to proceed.   OPERATIVE COURSE:  After being identified preoperatively by myself, the patient and I agreed upon the procedure and site of procedure.  The surgical site was marked.  Surgical consent had been signed.  He was given IV Ancef  as preoperative antibiotic prophylaxis.  He was transferred to the operating room and placed on the operating room table in supine position with the left upper extremity on an armboard.  General anesthesia was induced by the anesthesiologist.  Left upper extremity was prepped and draped in normal sterile  orthopaedic fashion.  A surgical pause was performed between the surgeons, anesthesia, and operating room staff, and all were in agreement as to the patient, procedure, and site of procedure.  Tourniquet at the proximal aspect of the extremity was inflated to 250 mmHg after exsanguination of the arm with an Esmarch bandage  Incision was made over the transverse carpal ligament and carried into the subcutaneous tissues by spreading technique.  Bipolar electrocautery was used to obtain hemostasis.  The palmar fascia was sharply incised.  The transverse carpal ligament was identified.  The fascia distal to the ligament was opened.  Retractor was placed and the flexor tendons were identified.  The flexor tendon to the ring finger was identified and retracted radially.  The transverse carpal ligament was then incised from distal to proximal under direct visualization.  Scissors were used to split the distal aspect of the volar antebrachial fascia.  A finger was placed into the wound to ensure complete decompression, which was the case.  The nerve was examined.  It was adherent to the radial leaflet.  The motor branch was identified and was intact.  The wound was copiously irrigated with sterile saline.  It was then closed with 4-0 nylon in a horizontal mattress fashion.  Incision was then made at the medial side of the elbow.  This was carried in subcutaneous tissues by spreading technique.  Bipolar electrocautery is used to obtain hemostasis.  The ulnar nerve was identified proximal to Osborne's ligament.  It  was then carefully released through Osborne's ligament first.  The muscular fascia over the FCU muscle was released and the FCU muscle belly spread.  The investing fascia over the nerve was then released under direct visualization.  The nerve was then decompressed proximally.  Again muscular fascia was released.  The investing fascia surrounding the nerve was then released under direct visualization.  There was a  small bleeding vessel near the nerve.  The incision was extended proximally to aid in identification of this.  It was treated with bipolar electrocautery to obtain hemostasis.  The wound was copiously irrigated with sterile saline.  The elbow was flexed and the nerve did not subluxate out of the groove.  The anterior leaflet of Osborne's ligament was repaired to the posterior subcutaneous tissues to provide a bolster against subluxation of the nerve.  For this was done with 2-0 Vicryl suture.  Inverted interrupted 4-0 Vicryl sutures were placed in subcutaneous tissues and skin was closed with 4-0 nylon in a horizontal mattress fashion.  The wounds were injected with 0.25% plain Marcaine  to aid in postoperative analgesia.  They were dressed with sterile Xeroform, 4x4s, an ABD, and wrapped with Kerlix and an Ace bandage.  Tourniquet was deflated at 38 minutes.  Fingertips were pink with brisk capillary refill after deflation of the tourniquet.  Operative drapes were broken down.  The patient was awoken from anesthesia safely.  He was transferred back to stretcher and taken to the PACU in stable condition.  I will see him back in the office in 1 week for postoperative followup.  I will give him a prescription for Tramadol  50 mg 1-2 tabs PO q6 hours prn pain, dispense # 20.    Brandon Macbride, MD Electronically signed, 03/29/23

## 2023-03-29 NOTE — Op Note (Signed)
 I assisted Surgeons and Role:    * Murrell Drivers, MD - Primary    DEWAINE Murrell Kuba, MD - Assisting on the Procedure(s): left carpal tunnel release left ulnar nerve decompression at elbow on 03/29/2023.  I provided assistance on this case as follows: Set up, approach, identification of the median nerve, retraction for compression of the median nerve, closure of the wound, Approach, identification of the ulnar nerve, retraction of the skin distally for decompression distally, retraction for compression of the nerve proximally, creation of sling and application of the dressing.  Electronically signed by: Kuba Murrell, MD Date: 03/29/2023 Time: 11:52 AM

## 2023-03-29 NOTE — Anesthesia Procedure Notes (Signed)
 Procedure Name: LMA Insertion Date/Time: 03/29/2023 10:53 AM  Performed by: Emilio Rock BIRCH, CRNAPre-anesthesia Checklist: Patient identified, Emergency Drugs available, Suction available and Patient being monitored Patient Re-evaluated:Patient Re-evaluated prior to induction Oxygen Delivery Method: Circle System Utilized Preoxygenation: Pre-oxygenation with 100% oxygen Induction Type: IV induction Ventilation: Mask ventilation without difficulty LMA: LMA inserted LMA Size: 5.0 Number of attempts: 1 Airway Equipment and Method: bite block Placement Confirmation: positive ETCO2 Tube secured with: Tape Dental Injury: Teeth and Oropharynx as per pre-operative assessment

## 2023-03-29 NOTE — Anesthesia Postprocedure Evaluation (Signed)
 Anesthesia Post Note  Patient: Brandon Thornton  Procedure(s) Performed: left carpal tunnel release (Left: Wrist) left ulnar nerve decompression at elbow (Left: Elbow)     Patient location during evaluation: PACU Anesthesia Type: General Level of consciousness: awake and alert Pain management: pain level controlled Vital Signs Assessment: post-procedure vital signs reviewed and stable Respiratory status: spontaneous breathing, nonlabored ventilation, respiratory function stable and patient connected to nasal cannula oxygen Cardiovascular status: blood pressure returned to baseline and stable Postop Assessment: no apparent nausea or vomiting Anesthetic complications: no   No notable events documented.  Last Vitals:  Vitals:   03/29/23 1245 03/29/23 1300  BP: 132/63 (!) 125/97  Pulse: (!) 56 64  Resp: 14 17  Temp:    SpO2: 92% 92%    Last Pain:  Vitals:   03/29/23 1315  TempSrc:   PainSc: 0-No pain                 [name]

## 2023-03-29 NOTE — Discharge Instructions (Addendum)
 No Tylenol  before 3:30pm today.   Post Anesthesia Home Care Instructions  Activity: Get plenty of rest for the remainder of the day. A responsible individual must stay with you for 24 hours following the procedure.  For the next 24 hours, DO NOT: -Drive a car -Advertising copywriter -Drink alcoholic beverages -Take any medication unless instructed by your physician -Make any legal decisions or sign important papers.  Meals: Start with liquid foods such as gelatin or soup. Progress to regular foods as tolerated. Avoid greasy, spicy, heavy foods. If nausea and/or vomiting occur, drink only clear liquids until the nausea and/or vomiting subsides. Call your physician if vomiting continues.  Special Instructions/Symptoms: Your throat may feel dry or sore from the anesthesia or the breathing tube placed in your throat during surgery. If this causes discomfort, gargle with warm salt water. The discomfort should disappear within 24 hours.  If you had a scopolamine patch placed behind your ear for the management of post- operative nausea and/or vomiting:  1. The medication in the patch is effective for 72 hours, after which it should be removed.  Wrap patch in a tissue and discard in the trash. Wash hands thoroughly with soap and water. 2. You may remove the patch earlier than 72 hours if you experience unpleasant side effects which may include dry mouth, dizziness or visual disturbances. 3. Avoid touching the patch. Wash your hands with soap and water after contact with the patch.   [facility] Instructions Hand Surgery  Wound Care: Keep your hand elevated above the level of your heart.  Do not allow it to dangle by your side.  Keep the dressing dry and do not remove it unless your doctor advises you to do so.  He will usually change it at the time of your post-op visit.  Moving your fingers is advised to stimulate circulation but will depend on the site of your surgery.  If you have a splint  applied, your doctor will advise you regarding movement.  Activity: Do not drive or operate machinery today.  Rest today and then you may return to your normal activity and work as indicated by your physician.  Diet:  Drink liquids today or eat a light diet.  You may resume a regular diet tomorrow.    General expectations: Pain for two to three days. Fingers may become slightly swollen.  Call your doctor if any of the following occur: Severe pain not relieved by pain medication. Elevated temperature. Dressing soaked with blood. Inability to move fingers. White or bluish color to fingers.

## 2023-03-30 ENCOUNTER — Encounter (HOSPITAL_BASED_OUTPATIENT_CLINIC_OR_DEPARTMENT_OTHER): Payer: Self-pay | Admitting: Orthopedic Surgery
